# Patient Record
Sex: Male | Born: 1960 | Hispanic: No | Marital: Single | Smoking: Current every day smoker
Health system: Southern US, Community
[De-identification: ages and names within clinical notes are randomized; demographics above are authoritative.]

## PROBLEM LIST (undated history)

## (undated) DIAGNOSIS — M109 Gout, unspecified: Secondary | ICD-10-CM

## (undated) HISTORY — PX: APPENDECTOMY: SHX54

---

## 2014-08-09 ENCOUNTER — Encounter (HOSPITAL_COMMUNITY): Payer: Self-pay | Admitting: Emergency Medicine

## 2014-08-09 ENCOUNTER — Observation Stay (HOSPITAL_COMMUNITY)
Admission: EM | Admit: 2014-08-09 | Discharge: 2014-08-10 | Disposition: A | Discharge status: Home / Self Care | Payer: No Typology Code available for payment source | Attending: Internal Medicine | Admitting: Internal Medicine

## 2014-08-09 ENCOUNTER — Emergency Department (HOSPITAL_COMMUNITY): Payer: No Typology Code available for payment source

## 2014-08-09 DIAGNOSIS — R55 Syncope and collapse: Secondary | ICD-10-CM | POA: Diagnosis not present

## 2014-08-09 DIAGNOSIS — R030 Elevated blood-pressure reading, without diagnosis of hypertension: Secondary | ICD-10-CM

## 2014-08-09 DIAGNOSIS — N179 Acute kidney failure, unspecified: Secondary | ICD-10-CM | POA: Diagnosis not present

## 2014-08-09 DIAGNOSIS — Z8739 Personal history of other diseases of the musculoskeletal system and connective tissue: Secondary | ICD-10-CM | POA: Diagnosis present

## 2014-08-09 DIAGNOSIS — E669 Obesity, unspecified: Secondary | ICD-10-CM | POA: Diagnosis not present

## 2014-08-09 DIAGNOSIS — R51 Headache: Secondary | ICD-10-CM | POA: Diagnosis not present

## 2014-08-09 DIAGNOSIS — F17200 Nicotine dependence, unspecified, uncomplicated: Secondary | ICD-10-CM

## 2014-08-09 DIAGNOSIS — IMO0001 Reserved for inherently not codable concepts without codable children: Secondary | ICD-10-CM | POA: Diagnosis present

## 2014-08-09 DIAGNOSIS — R519 Headache, unspecified: Secondary | ICD-10-CM | POA: Diagnosis present

## 2014-08-09 DIAGNOSIS — E785 Hyperlipidemia, unspecified: Secondary | ICD-10-CM | POA: Diagnosis not present

## 2014-08-09 DIAGNOSIS — Z9089 Acquired absence of other organs: Secondary | ICD-10-CM | POA: Insufficient documentation

## 2014-08-09 DIAGNOSIS — Z6832 Body mass index (BMI) 32.0-32.9, adult: Secondary | ICD-10-CM | POA: Diagnosis not present

## 2014-08-09 DIAGNOSIS — M109 Gout, unspecified: Secondary | ICD-10-CM | POA: Diagnosis not present

## 2014-08-09 HISTORY — DX: Gout, unspecified: M10.9

## 2014-08-09 LAB — URINALYSIS, ROUTINE W REFLEX MICROSCOPIC
Bilirubin Urine: NEGATIVE
GLUCOSE, UA: NEGATIVE mg/dL
Hgb urine dipstick: NEGATIVE
Ketones, ur: NEGATIVE mg/dL
LEUKOCYTES UA: NEGATIVE
Nitrite: NEGATIVE
PH: 6.5 (ref 5.0–8.0)
Protein, ur: 30 mg/dL — AB
Specific Gravity, Urine: 1.018 (ref 1.005–1.030)
Urobilinogen, UA: 0.2 mg/dL (ref 0.0–1.0)

## 2014-08-09 LAB — CBC WITH DIFFERENTIAL/PLATELET
Basophils Absolute: 0 10*3/uL (ref 0.0–0.1)
Basophils Relative: 0 % (ref 0–1)
EOS PCT: 0 % (ref 0–5)
Eosinophils Absolute: 0 10*3/uL (ref 0.0–0.7)
HCT: 46.3 % (ref 39.0–52.0)
HEMOGLOBIN: 16.2 g/dL (ref 13.0–17.0)
LYMPHS ABS: 1.3 10*3/uL (ref 0.7–4.0)
LYMPHS PCT: 12 % (ref 12–46)
MCH: 32.4 pg (ref 26.0–34.0)
MCHC: 35 g/dL (ref 30.0–36.0)
MCV: 92.6 fL (ref 78.0–100.0)
Monocytes Absolute: 1 10*3/uL (ref 0.1–1.0)
Monocytes Relative: 9 % (ref 3–12)
NEUTROS PCT: 79 % — AB (ref 43–77)
Neutro Abs: 8.7 10*3/uL — ABNORMAL HIGH (ref 1.7–7.7)
Platelets: 173 10*3/uL (ref 150–400)
RBC: 5 MIL/uL (ref 4.22–5.81)
RDW: 13.1 % (ref 11.5–15.5)
WBC: 11 10*3/uL — ABNORMAL HIGH (ref 4.0–10.5)

## 2014-08-09 LAB — BASIC METABOLIC PANEL
Anion gap: 17 — ABNORMAL HIGH (ref 5–15)
BUN: 20 mg/dL (ref 6–23)
CO2: 23 meq/L (ref 19–32)
Calcium: 10.1 mg/dL (ref 8.4–10.5)
Chloride: 100 mEq/L (ref 96–112)
Creatinine, Ser: 1.39 mg/dL — ABNORMAL HIGH (ref 0.50–1.35)
GFR calc Af Amer: 66 mL/min — ABNORMAL LOW (ref >90)
GFR calc non Af Amer: 57 mL/min — ABNORMAL LOW (ref >90)
GLUCOSE: 127 mg/dL — AB (ref 70–99)
POTASSIUM: 4.3 meq/L (ref 3.7–5.3)
Sodium: 140 mEq/L (ref 137–147)

## 2014-08-09 LAB — I-STAT TROPONIN, ED: Troponin i, poc: 0.02 ng/mL (ref 0.00–0.08)

## 2014-08-09 LAB — D-DIMER, QUANTITATIVE (NOT AT ARMC)

## 2014-08-09 LAB — URINE MICROSCOPIC-ADD ON: Urine-Other: NONE SEEN

## 2014-08-09 MED ORDER — SODIUM CHLORIDE 0.9 % IV BOLUS (SEPSIS)
1000.0000 mL | Freq: Once | INTRAVENOUS | Status: AC
  Administered 2014-08-09: 1000 mL via INTRAVENOUS

## 2014-08-09 MED ORDER — NICOTINE 14 MG/24HR TD PT24
14.0000 mg | MEDICATED_PATCH | Freq: Every day | TRANSDERMAL | Status: DC
  Filled 2014-08-09 (×2): qty 1

## 2014-08-09 NOTE — H&P (Addendum)
[facility] Admission History and Physical       Kourosh Jablonsky RUE:[id number] DOB: 1961/10/23 DOA: 08/09/2014  Referring physician: EDP PCP: No PCP Per Patient  Specialists:   Chief Complaint:  Passed Out  HPI: Donald Livingston is a 53 y.o. male with a history of Hyperlipidemia and Gout who was at work and suffered a syncopal episode and collapsed and hit the right side of his head.  He was passed out for 30 seconds and awakened with a headache on the right side of his head.   He denied having any prodromal chest pain or headaches or dizziness.    He reports that he had come in from working and he drank some water, and sat down then he drank some more water and he was told that he began to cough and passed out.    He reports that he  Had not eaten, but he states that is not unusual for him.    In the ED he was evaluated and sent for a CT scan of the Head and referred for medical admission.    The CT scan of the head was negative for any acute findings.     Review of Systems:  Constitutional: No Weight Loss, No Weight Gain, Night Sweats, Fevers, Chills, Dizziness, Fatigue, or Generalized Weakness HEENT: No Headaches, Difficulty Swallowing,Tooth/Dental Problems,Sore Throat,  No Sneezing, Rhinitis, Ear Ache, Nasal Congestion, or Post Nasal Drip,  Cardio-vascular:  No Chest pain, Orthopnea, PND, Edema in Lower Extremities, Anasarca, Dizziness, Palpitations  Resp: No Dyspnea, No DOE,  +Non-Productive Cough, No Hemoptysis, No Wheezing.    GI: No Heartburn, Indigestion, Abdominal Pain, Nausea, Vomiting, Diarrhea, Hematemesis, Hematochezia, Melena, Change in Bowel Habits,  Loss of Appetite  GU: No Dysuria, Change in Color of Urine, No Urgency or Frequency, No Flank pain.  Musculoskeletal: No Joint Pain or Swelling, No Decreased Range of Motion, No Back Pain.  Neurologic: +Syncope, No Seizures, Muscle Weakness, Paresthesia, Vision Disturbance or Loss, No Diplopia, No Vertigo, No Difficulty Walking,    Skin: No Rash or Lesions. Psych: No Change in Mood or Affect, No Depression or Anxiety, No Memory loss, No Confusion, or Hallucinations   Past Medical History  Diagnosis Date  . Gout         Hyperlipidemia  Past Surgical History  Procedure Laterality Date  . Appendectomy      age 89     Prior to Admission medications   Medication Sig Start Date End Date Taking? Authorizing Provider  allopurinol (ZYLOPRIM) 300 MG tablet Take 300 mg by mouth daily.   Yes Historical Provider, MD  atorvastatin (LIPITOR) 10 MG tablet Take 10 mg by mouth daily.   Yes Historical Provider, MD  Multiple Vitamins-Minerals (CENTRUM ADULTS PO) Take 1 tablet by mouth daily.   Yes Historical Provider, MD    No Known Allergies   Social History:  reports that he has been smoking.  He does not have any smokeless tobacco history on file. He reports that he drinks alcohol. He reports that he does not use illicit drugs.     History reviewed. No pertinent family history.     Physical Exam:  GEN:  Pleasant  Well Nourished and Well Developed 53 y.o. Asian American male examined  and in no acute distress; cooperative with exam Filed Vitals:   08/09/14 1740 08/09/14 1855  BP: 109/64 103/62  Pulse: 91 85  Resp: 17 18  SpO2: 97% 97%   Blood pressure 103/62, pulse 85, resp. rate 18,  SpO2 97.00%. PSYCH: He is alert and oriented x4; does not appear anxious does not appear depressed; affect is normal HEENT: Normocephalic and Atraumatic, Mucous membranes pink; PERRLA; EOM intact; Fundi:  Benign;  No scleral icterus, Nares: Patent, Oropharynx: Clear, Fair Dentition,    Neck:  FROM, No Cervical Lymphadenopathy nor Thyromegaly or Carotid Bruit; No JVD; Breasts:: Not examined CHEST WALL: No tenderness CHEST: Normal respiration, clear to auscultation bilaterally HEART: Regular rate and rhythm; no murmurs rubs or gallops BACK: No kyphosis or scoliosis; No CVA tenderness ABDOMEN: Positive Bowel Sounds, Soft Non-Tender;  No Masses, No Organomegaly Rectal Exam: Not done EXTREMITIES: No Cyanosis, Clubbing, or Edema; No Ulcerations. Genitalia: not examined PULSES: 2+ and symmetric SKIN: Normal hydration no rash or ulceration CNS:  A x O x 4, No Focal Deficits  Vascular: pulses palpable throughout    Labs on Admission:  Basic Metabolic Panel:  Recent Labs Lab 08/09/14 1852  NA 140  K 4.3  CL 100  CO2 23  GLUCOSE 127*  BUN 20  CREATININE 1.39*  CALCIUM 10.1   Liver Function Tests: No results found for this basename: AST, ALT, ALKPHOS, BILITOT, PROT, ALBUMIN,  in the last 168 hours No results found for this basename: LIPASE, AMYLASE,  in the last 168 hours No results found for this basename: AMMONIA,  in the last 168 hours CBC:  Recent Labs Lab 08/09/14 1852  WBC 11.0*  NEUTROABS 8.7*  HGB 16.2  HCT 46.3  MCV 92.6  PLT 173   Cardiac Enzymes: No results found for this basename: CKTOTAL, CKMB, CKMBINDEX, TROPONINI,  in the last 168 hours  BNP (last 3 results) No results found for this basename: PROBNP,  in the last 8760 hours CBG: No results found for this basename: GLUCAP,  in the last 168 hours  Radiological Exams on Admission: Ct Head Wo Contrast  08/09/2014   CLINICAL DATA:  syncope  EXAM: CT HEAD WITHOUT CONTRAST  TECHNIQUE: Contiguous axial images were obtained from the base of the skull through the vertex without intravenous contrast.  COMPARISON:  None.  FINDINGS: There is no evidence of acute intracranial hemorrhage, brain edema, mass lesion, acute infarction, mass effect, or midline shift. Acute infarct may be inapparent on noncontrast CT. No other intra-axial abnormalities are seen, and the ventricles and sulci are within normal limits in size and symmetry. No abnormal extra-axial fluid collections or masses are identified. No significant calvarial abnormality.  IMPRESSION: 1. Negative for bleed or other acute intracranial process.   Electronically Signed   By: Oley Balm  M.D.   On: 08/09/2014 21:39     EKG: Independently reviewed.    Assessment/Plan:   53 y.o. male with  Principal Problem:   1.   Syncope and collapse- probably Vasovagal   Telemetry   Neuro checks   CT scan of head done and pending Pending   Active Problems:   2.   Headache- post fall from #1    Neuro Checks   CT scan of head done and pending   Pain control PRN     3.   AKI (acute kidney injury)   Gentle Rehydration     4.   Tobacco use disorder   Nicotine Patch daily   Counseled re: Cessation     5.    DVT Prophylaxis   Lovenox      Code Status:   FULL CODE Family Communication:   No Family at Bedside  Disposition Plan:  Time spent:  60 Minutes  Ron Parker [facility] Pager [phone]   If 7AM -7PM Please Contact the Day Rounding Team MD for [facility]  If 7PM-7AM, Please Contact night-coverage  [email or web address] Password [email or web address] 08/09/2014, 9:47 PM

## 2014-08-09 NOTE — ED Provider Notes (Addendum)
CSN: [id number]     Arrival date & time 08/09/14  1719 History   First MD Initiated Contact with Patient 08/09/14 1836     Chief Complaint  Patient presents with  . Loss of Consciousness     (Consider location/radiation/quality/duration/timing/severity/associated sxs/prior Treatment) HPI Comments: Patient presents with syncope. He was at work today and he states he was in and out all day. He came inside to drink some water periodically. On the last time he came inside, he sat down and reportedly he coughed and then lost consciousness. He passed out while he was still seated. He does remember this. He denies any preceding symptoms. There is no dizziness palpitations chest pain or shortness of breath. He denies a past history of syncope. He denies any past cardiac problems. He denies any recent illnesses.  He did state he hit his head after he fell. He has a headache since that time. He denies any other injuries from the fall. Pt lives in  and travels here to work.  No FH of sudden cardiac death.  Patient is a 53 y.o. male presenting with syncope.  Loss of Consciousness Associated symptoms: no chest pain, no diaphoresis, no dizziness, no fever, no headaches, no nausea, no shortness of breath, no vomiting and no weakness     Past Medical History  Diagnosis Date  . Gout    History reviewed. No pertinent past surgical history. History reviewed. No pertinent family history. History  Substance Use Topics  . Smoking status: Never Smoker   . Smokeless tobacco: Not on file  . Alcohol Use: No    Review of Systems  Constitutional: Negative for fever, chills, diaphoresis and fatigue.  HENT: Negative for congestion, rhinorrhea and sneezing.   Eyes: Negative.   Respiratory: Negative for cough, chest tightness and shortness of breath.   Cardiovascular: Positive for syncope. Negative for chest pain and leg swelling.  Gastrointestinal: Negative for nausea, vomiting, abdominal pain, diarrhea  and blood in stool.  Genitourinary: Negative for frequency, hematuria, flank pain and difficulty urinating.  Musculoskeletal: Negative for arthralgias and back pain.  Skin: Negative for rash.  Neurological: Positive for syncope. Negative for dizziness, speech difficulty, weakness, numbness and headaches.      Allergies  Review of patient's allergies indicates no known allergies.  Home Medications   Prior to Admission medications   Medication Sig Start Date End Date Taking? Authorizing Provider  allopurinol (ZYLOPRIM) 300 MG tablet Take 300 mg by mouth daily.   Yes Historical Provider, MD  atorvastatin (LIPITOR) 10 MG tablet Take 10 mg by mouth daily.   Yes Historical Provider, MD  Multiple Vitamins-Minerals (CENTRUM ADULTS PO) Take 1 tablet by mouth daily.   Yes Historical Provider, MD   BP 103/62  Pulse 85  Resp 18  SpO2 97% Physical Exam  Constitutional: He is oriented to person, place, and time. He appears well-developed and well-nourished.  HENT:  Head: Normocephalic.  Small hematoma to right forehead  Eyes: Pupils are equal, round, and reactive to light.  Neck: Normal range of motion. Neck supple.  No pain along the spine  Cardiovascular: Normal rate, regular rhythm and normal heart sounds.   Pulmonary/Chest: Effort normal and breath sounds normal. No respiratory distress. He has no wheezes. He has no rales. He exhibits no tenderness.  Abdominal: Soft. Bowel sounds are normal. There is no tenderness. There is no rebound and no guarding.  Musculoskeletal: Normal range of motion. He exhibits no edema.  No calf tenderness  Lymphadenopathy:  He has no cervical adenopathy.  Neurological: He is alert and oriented to person, place, and time. He has normal strength. No cranial nerve deficit or sensory deficit. GCS eye subscore is 4. GCS verbal subscore is 5. GCS motor subscore is 6.  FTN intact, no pronator drift  Skin: Skin is warm and dry. No rash noted.  Psychiatric: He  has a normal mood and affect.    ED Course  Procedures (including critical care time) Labs Review Results for orders placed during the hospital encounter of 08/09/14  CBC WITH DIFFERENTIAL      Result Value Ref Range   WBC 11.0 (*) 4.0 - 10.5 K/uL   RBC 5.00  4.22 - 5.81 MIL/uL   Hemoglobin 16.2  13.0 - 17.0 g/dL   HCT 16.1  09.6 - 04.5 %   MCV 92.6  78.0 - 100.0 fL   MCH 32.4  26.0 - 34.0 pg   MCHC 35.0  30.0 - 36.0 g/dL   RDW 40.9  81.1 - 91.4 %   Platelets 173  150 - 400 K/uL   Neutrophils Relative % 79 (*) 43 - 77 %   Neutro Abs 8.7 (*) 1.7 - 7.7 K/uL   Lymphocytes Relative 12  12 - 46 %   Lymphs Abs 1.3  0.7 - 4.0 K/uL   Monocytes Relative 9  3 - 12 %   Monocytes Absolute 1.0  0.1 - 1.0 K/uL   Eosinophils Relative 0  0 - 5 %   Eosinophils Absolute 0.0  0.0 - 0.7 K/uL   Basophils Relative 0  0 - 1 %   Basophils Absolute 0.0  0.0 - 0.1 K/uL  BASIC METABOLIC PANEL      Result Value Ref Range   Sodium 140  137 - 147 mEq/L   Potassium 4.3  3.7 - 5.3 mEq/L   Chloride 100  96 - 112 mEq/L   CO2 23  19 - 32 mEq/L   Glucose, Bld 127 (*) 70 - 99 mg/dL   BUN 20  6 - 23 mg/dL   Creatinine, Ser 7.82 (*) 0.50 - 1.35 mg/dL   Calcium 95.6  8.4 - 21.3 mg/dL   GFR calc non Af Amer 57 (*) >90 mL/min   GFR calc Af Amer 66 (*) >90 mL/min   Anion gap 17 (*) 5 - 15  URINALYSIS, ROUTINE W REFLEX MICROSCOPIC      Result Value Ref Range   Color, Urine YELLOW  YELLOW   APPearance CLOUDY (*) CLEAR   Specific Gravity, Urine 1.018  1.005 - 1.030   pH 6.5  5.0 - 8.0   Glucose, UA NEGATIVE  NEGATIVE mg/dL   Hgb urine dipstick NEGATIVE  NEGATIVE   Bilirubin Urine NEGATIVE  NEGATIVE   Ketones, ur NEGATIVE  NEGATIVE mg/dL   Protein, ur 30 (*) NEGATIVE mg/dL   Urobilinogen, UA 0.2  0.0 - 1.0 mg/dL   Nitrite NEGATIVE  NEGATIVE   Leukocytes, UA NEGATIVE  NEGATIVE  D-DIMER, QUANTITATIVE      Result Value Ref Range   D-Dimer, Quant <0.27  0.00 - 0.48 ug/mL-FEU  URINE MICROSCOPIC-ADD ON       Result Value Ref Range   Urine-Other       Value: NO FORMED ELEMENTS SEEN ON URINE MICROSCOPIC EXAMINATION  I-STAT TROPOININ, ED      Result Value Ref Range   Troponin i, poc 0.02  0.00 - 0.08 ng/mL   Comment 3  No results found.    Imaging Review No results found.   EKG Interpretation None      Date: 08/09/2014  Rate: 90  Rhythm: normal sinus rhythm  QRS Axis: normal  Intervals: normal  ST/T Wave abnormalities: normal  Conduction Disutrbances:none  Narrative Interpretation:   Old EKG Reviewed: none available   MDM   Final diagnoses:  Syncope, unspecified syncope type    Patient presents after an episode of syncope. His labs are unremarkable. His EKG is negative for ischemic changes. He's had no apparent arrhythmias on monitoring. However I am concerned about his sudden syncope without preceding symptoms. D-dimer is negative. I feel that he should be monitored overnight and possibly have an echocardiogram in the morning. I will consult hospitalist for admission.    Rolan Bucco, MD 08/09/14 2111  Rolan Bucco, MD 08/09/14 2122

## 2014-08-09 NOTE — ED Notes (Signed)
Per EMS: Pt from work.  Has not eaten today.  Pt states that he was at work, coughed a few times, lost consciousness, and hit the rt side of his head.  No hematoma.  CBG normal.  Was out for about 30 seconds per witnesses.  Only medical hx of gout.

## 2014-08-09 NOTE — ED Notes (Signed)
Patient provided PO fluids and sandwich tray.

## 2014-08-09 NOTE — ED Notes (Signed)
Bed: WA12 Expected date:  Expected time:  Means of arrival:  Comments: EMS 

## 2014-08-09 NOTE — ED Notes (Signed)
Pt aware urine sample is needed 

## 2014-08-09 NOTE — ED Notes (Signed)
Initial contact-A&Ox4. Moving all extremities equally. Confirms EMS report. Denies dizziness, numbness. C/o "extreme headache" after hitting head. Denies blurry vision. Speaking full, clear sentences. RR even/unlabored. MD Belfi at bedside.

## 2014-08-10 ENCOUNTER — Encounter (HOSPITAL_COMMUNITY): Payer: Self-pay

## 2014-08-10 DIAGNOSIS — R030 Elevated blood-pressure reading, without diagnosis of hypertension: Secondary | ICD-10-CM

## 2014-08-10 DIAGNOSIS — E669 Obesity, unspecified: Secondary | ICD-10-CM | POA: Diagnosis present

## 2014-08-10 DIAGNOSIS — Z8739 Personal history of other diseases of the musculoskeletal system and connective tissue: Secondary | ICD-10-CM | POA: Diagnosis present

## 2014-08-10 DIAGNOSIS — IMO0001 Reserved for inherently not codable concepts without codable children: Secondary | ICD-10-CM | POA: Diagnosis present

## 2014-08-10 LAB — BASIC METABOLIC PANEL
ANION GAP: 13 (ref 5–15)
BUN: 17 mg/dL (ref 6–23)
CHLORIDE: 104 meq/L (ref 96–112)
CO2: 26 mEq/L (ref 19–32)
Calcium: 9.6 mg/dL (ref 8.4–10.5)
Creatinine, Ser: 1.01 mg/dL (ref 0.50–1.35)
GFR calc non Af Amer: 84 mL/min — ABNORMAL LOW (ref >90)
Glucose, Bld: 140 mg/dL — ABNORMAL HIGH (ref 70–99)
POTASSIUM: 4.7 meq/L (ref 3.7–5.3)
SODIUM: 143 meq/L (ref 137–147)

## 2014-08-10 LAB — CBC
HCT: 47 % (ref 39.0–52.0)
HEMOGLOBIN: 16.3 g/dL (ref 13.0–17.0)
MCH: 33.2 pg (ref 26.0–34.0)
MCHC: 34.7 g/dL (ref 30.0–36.0)
MCV: 95.7 fL (ref 78.0–100.0)
Platelets: 180 10*3/uL (ref 150–400)
RBC: 4.91 MIL/uL (ref 4.22–5.81)
RDW: 13.3 % (ref 11.5–15.5)
WBC: 5.9 10*3/uL (ref 4.0–10.5)

## 2014-08-10 LAB — RAPID URINE DRUG SCREEN, HOSP PERFORMED
Amphetamines: NOT DETECTED
Barbiturates: NOT DETECTED
Benzodiazepines: NOT DETECTED
COCAINE: NOT DETECTED
OPIATES: NOT DETECTED
Tetrahydrocannabinol: NOT DETECTED

## 2014-08-10 MED ORDER — OXYCODONE HCL 5 MG PO TABS: 5.0000 mg | ORAL_TABLET | ORAL | Status: DC | PRN

## 2014-08-10 MED ORDER — SODIUM CHLORIDE 0.9 % IJ SOLN: 3.0000 mL | Freq: Two times a day (BID) | INTRAMUSCULAR | Status: DC

## 2014-08-10 MED ORDER — ALLOPURINOL 300 MG PO TABS
300.0000 mg | ORAL_TABLET | Freq: Every day | ORAL | Status: DC
  Filled 2014-08-10: qty 1

## 2014-08-10 MED ORDER — ATORVASTATIN CALCIUM 10 MG PO TABS
10.0000 mg | ORAL_TABLET | Freq: Every day | ORAL | Status: DC
  Filled 2014-08-10: qty 1

## 2014-08-10 MED ORDER — ACETAMINOPHEN 325 MG PO TABS: 650.0000 mg | ORAL_TABLET | Freq: Four times a day (QID) | ORAL | Status: DC | PRN

## 2014-08-10 MED ORDER — ENOXAPARIN SODIUM 40 MG/0.4ML ~~LOC~~ SOLN
40.0000 mg | SUBCUTANEOUS | Status: DC
  Filled 2014-08-10: qty 0.4

## 2014-08-10 MED ORDER — ONDANSETRON HCL 4 MG PO TABS: 4.0000 mg | ORAL_TABLET | Freq: Four times a day (QID) | ORAL | Status: DC | PRN

## 2014-08-10 MED ORDER — SODIUM CHLORIDE 0.9 % IV SOLN
INTRAVENOUS | Status: DC
  Administered 2014-08-10: 05:00:00 via INTRAVENOUS

## 2014-08-10 MED ORDER — ACETAMINOPHEN 650 MG RE SUPP: 650.0000 mg | Freq: Four times a day (QID) | RECTAL | Status: DC | PRN

## 2014-08-10 MED ORDER — ONDANSETRON HCL 4 MG/2ML IJ SOLN: 4.0000 mg | Freq: Four times a day (QID) | INTRAMUSCULAR | Status: DC | PRN

## 2014-08-10 MED ORDER — HYDROMORPHONE HCL PF 1 MG/ML IJ SOLN: 0.5000 mg | INTRAMUSCULAR | Status: DC | PRN

## 2014-08-10 MED ORDER — ALUM & MAG HYDROXIDE-SIMETH 200-200-20 MG/5ML PO SUSP: 30.0000 mL | Freq: Four times a day (QID) | ORAL | Status: DC | PRN

## 2014-08-10 NOTE — Discharge Instructions (Signed)
Syncope °Syncope is a medical term for fainting or passing out. This means you lose consciousness and drop to the ground. People are generally unconscious for less than 5 minutes. You may have some muscle twitches for up to 15 seconds before waking up and returning to normal. Syncope occurs more often in older adults, but it can happen to anyone. While most causes of syncope are not dangerous, syncope can be a sign of a serious medical problem. It is important to seek medical care.  °CAUSES  °Syncope is caused by a sudden drop in blood flow to the brain. The specific cause is often not determined. Factors that can bring on syncope include: °· Taking medicines that lower blood pressure. °· Sudden changes in posture, such as standing up quickly. °· Taking more medicine than prescribed. °· Standing in one place for too long. °· Seizure disorders. °· Dehydration and excessive exposure to heat. °· Low blood sugar (hypoglycemia). °· Straining to have a bowel movement. °· Heart disease, irregular heartbeat, or other circulatory problems. °· Fear, emotional distress, seeing blood, or severe pain. °SYMPTOMS  °Right before fainting, you may: °· Feel dizzy or light-headed. °· Feel nauseous. °· See all white or all black in your field of vision. °· Have cold, clammy skin. °DIAGNOSIS  °Your health care provider will ask about your symptoms, perform a physical exam, and perform an electrocardiogram (ECG) to record the electrical activity of your heart. Your health care provider may also perform other heart or blood tests to determine the cause of your syncope which may include: °· Transthoracic echocardiogram (TTE). During echocardiography, sound waves are used to evaluate how blood flows through your heart. °· Transesophageal echocardiogram (TEE). °· Cardiac monitoring. This allows your health care provider to monitor your heart rate and rhythm in real time. °· Holter monitor. This is a portable device that records your  heartbeat and can help diagnose heart arrhythmias. It allows your health care provider to track your heart activity for several days, if needed. °· Stress tests by exercise or by giving medicine that makes the heart beat faster. °TREATMENT  °In most cases, no treatment is needed. Depending on the cause of your syncope, your health care provider may recommend changing or stopping some of your medicines. °HOME CARE INSTRUCTIONS °· Have someone stay with you until you feel stable. °· Do not drive, use machinery, or play sports until your health care provider says it is okay. °· Keep all follow-up appointments as directed by your health care provider. °· Lie down right away if you start feeling like you might faint. Breathe deeply and steadily. Wait until all the symptoms have passed. °· Drink enough fluids to keep your urine clear or pale yellow. °· If you are taking blood pressure or heart medicine, get up slowly and take several minutes to sit and then stand. This can reduce dizziness. °SEEK IMMEDIATE MEDICAL CARE IF:  °· You have a severe headache. °· You have unusual pain in the chest, abdomen, or back. °· You are bleeding from your mouth or rectum, or you have black or tarry stool. °· You have an irregular or very fast heartbeat. °· You have pain with breathing. °· You have repeated fainting or seizure-like jerking during an episode. °· You faint when sitting or lying down. °· You have confusion. °· You have trouble walking. °· You have severe weakness. °· You have vision problems. °If you fainted, call your local emergency services (911 in [place] Do not drive   yourself to the hospital.  °MAKE SURE YOU: °· Understand these instructions. °· Will watch your condition. °· Will get help right away if you are not doing well or get worse. °Document Released: 11/24/2005 Document Revised: 11/29/2013 Document Reviewed: 01/23/2012 °[employer]® Patient Information ©2015 [employer]. This information is not intended to replace  advice given to you by your health care provider. Make sure you discuss any questions you have with your health care provider. ° °

## 2014-08-10 NOTE — Progress Notes (Signed)
Pt states he is ready to go and is about to remove his IV and walk out. Pt encouraged to wait for MD to eval and assure a safe discharge home. Pt is calm, in bed and continues to wait at this time.

## 2014-08-10 NOTE — Progress Notes (Signed)
UR Completed.  Donald Livingston Jane [phone]  

## 2014-08-10 NOTE — Discharge Summary (Addendum)
Discharge Summary  Donald Livingston EAV:[id number] DOB: 06/24/1961  PCP: Dr. Janine Ores  Phone:   [phone]   Fax: [phone]  Admit date: 08/09/2014 Discharge date: 08/10/2014  Time spent: 25 minutes  Recommendations for Outpatient Follow-up:  1. Patient will follow up with his primary care physician in [place] next week. At that time advised to recheck blood pressure  2.  he's been advised to not return back to work until 9/4 and stable well-hydrated  Discharge Diagnoses:  Active Hospital Problems   Diagnosis Date Noted  . Syncope and collapse 08/09/2014  . Obesity (BMI 30-39.9) 08/10/2014  . History of gout 08/10/2014  . Elevated blood pressure 08/10/2014  . Headache 08/09/2014  . AKI (acute kidney injury) 08/09/2014  . Tobacco use disorder 08/09/2014    Resolved Hospital Problems   Diagnosis Date Noted Date Resolved  No resolved problems to display.    Discharge Condition:  improved, being discharged home   Diet recommendation:  regular   Filed Weights   08/10/14 0001  Weight: 92.4 kg (203 lb 11.3 oz)    History of present illness:   53 year old Asian male from [place] who is in [place] for his work when yesterday, 9/2 he was coming in and out of the sun back to work and all of a sudden immediately passed out. He stated that he might have felt a little lightheaded before hand, but had no issues. Coworkers were with him and patient to regain consciousness only a few seconds later. He had hit his head when he fell. Patient complained of a headache and seeing if she spots when he awoke, but no other findings. He was brought into the emergency room for further evaluation. Laboratory noted a minimal white count elevation of 11 and elevated creatinine of 1.3, but other lab work was unrevealing. Patient was admitted to the hospitalist service overnight for observation   Hospital Course:  Principal Problem:   Syncope and collapse: Suspected to be orthostatic versus  vasovagal. On IV fluids, creatinine improved the following day to 1.0. Patient's orthostatics were checked and found to be unrevealing by following day. Urine drug screen unremarkable. White blood cell count which have been minimally elevated on admission, normalized by the following day and if anything possibly stress margination. Hemoglobin did not change. CT scan done on admission was unrevealing. Patient was kept on telemetry, no events occurred. He remained in normal sinus rhythm during his hospitalization. With no further findings, patient is felt to be stable for discharge and advised to stay hydrated. He'll followup with his PCP  Active Problems:   Headache: Secondary to hitting his head when he fell. No neurological findings. CT scan of the head unremarkable. By following day, still present, but much less so    AKI (acute kidney injury): As above. Secondary dehydration. Resolved.    Tobacco use disorder patient counseled to quit. On nicotine patch during hospitalization.    Obesity (BMI 30-39.9): Patient is criteria would BMI greater than 30.    History of gout: Stable medical issue. Patient will continue allopurinol on discharge.    Elevated blood pressure: Fall and hydration, patient's blood pressure noted to be in the 130s-140 systolic, 90s diastolic. Her condition is for PCP to follow this up   Procedures:   none   Consultations:   none   Discharge Exam: BP 139/91  Pulse 67  Temp(Src) 98.2 F (36.8 C) (Oral)  Resp 18  Ht  (1.676 m)  Wt 92.4 kg (203 lb  11.3 oz)  BMI 32.89 kg/m2  SpO2 96%  General: Alert and oriented x3, no acute distress  Cardiovascular:  regular rate and rhythm, S1-S2  Respiratory:  clear to auscultation bilaterally   Discharge Instructions You were cared for by a hospitalist during your hospital stay. If you have any questions about your discharge medications or the care you received while you were in the hospital after you are discharged,  you can call the unit and asked to speak with the hospitalist on call if the hospitalist that took care of you is not available. Once you are discharged, your primary care physician will handle any further medical issues. Please note that NO REFILLS for any discharge medications will be authorized once you are discharged, as it is imperative that you return to your primary care physician (or establish a relationship with a primary care physician if you do not have one) for your aftercare needs so that they can reassess your need for medications and monitor your lab values.     Medication List         allopurinol 300 MG tablet  Commonly known as:  ZYLOPRIM  Take 300 mg by mouth daily.     atorvastatin 10 MG tablet  Commonly known as:  LIPITOR  Take 10 mg by mouth daily.     CENTRUM ADULTS PO  Take 1 tablet by mouth daily.       No Known Allergies     Follow-up Information   Follow up with Dr. Janine Ores Phone:   [phone]   Fax: [phone] In 1 week.       The results of significant diagnostics from this hospitalization (including imaging, microbiology, ancillary and laboratory) are listed below for reference.    Significant Diagnostic Studies: Ct Head Wo Contrast  08/09/2014     IMPRESSION: 1. Negative for bleed or other acute intracranial process.   Electronically Signed   By: Oley Balm M.D.   On: 08/09/2014 21:39    Labs: Basic Metabolic Panel:  Recent Labs Lab 08/09/14 1852 08/10/14 1041  NA 140 143  K 4.3 4.7  CL 100 104  CO2 23 26  GLUCOSE 127* 140*  BUN 20 17  CREATININE 1.39* 1.01  CALCIUM 10.1 9.6   CBC:  Recent Labs Lab 08/09/14 1852 08/10/14 1041  WBC 11.0* 5.9  NEUTROABS 8.7*  --   HGB 16.2 16.3  HCT 46.3 47.0  MCV 92.6 95.7  PLT 173 180     Signed:  KRISHNAN,SENDIL K  [facility] 08/10/2014, 2:00 PM

## 2015-09-02 IMAGING — CT CT HEAD W/O CM
2 series · 16 of 30 positions shown, 20 images · non-contrast
Comparison: None.

CLINICAL DATA: syncope

EXAM:
CT HEAD WITHOUT CONTRAST
TECHNIQUE: Contiguous axial images were obtained from the base of the skull
through the vertex without intravenous contrast.

[Series 2: head w/o · axial · non-contrast · 0.45mm/px · z∈[-73,+47]mm · 13 of 30 slices shown, 17 images]
[im 3/30  brain]
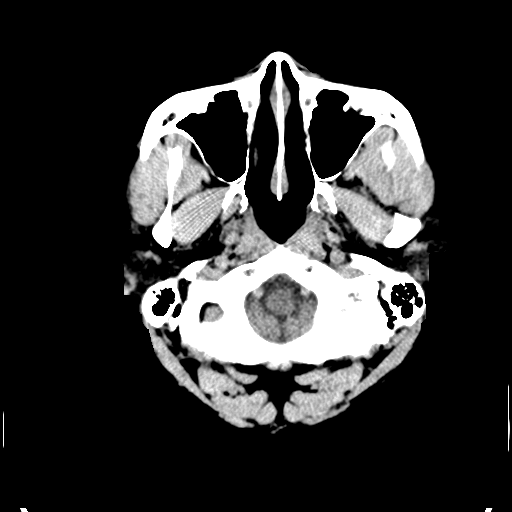
[im 3/30  bone]
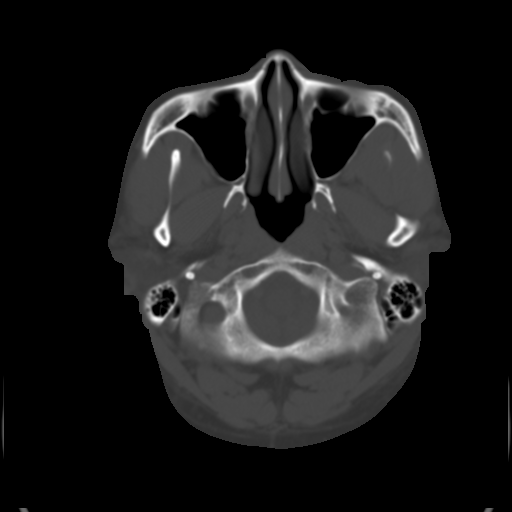
[im 5/30  brain]
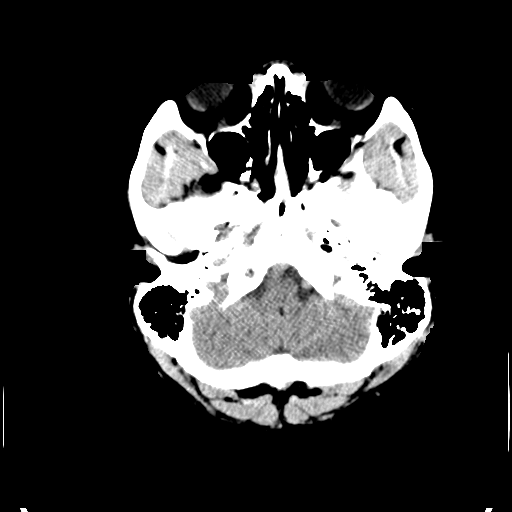
[im 7/30  brain]
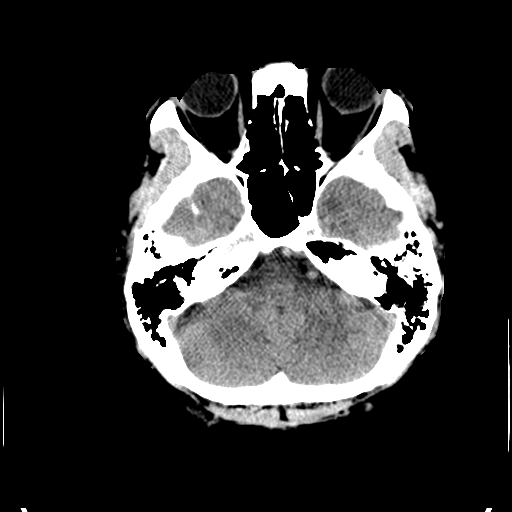
[im 9/30  brain]
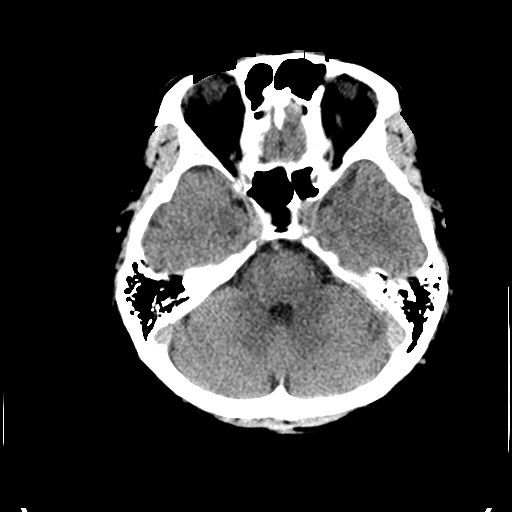
[im 11/30  brain]
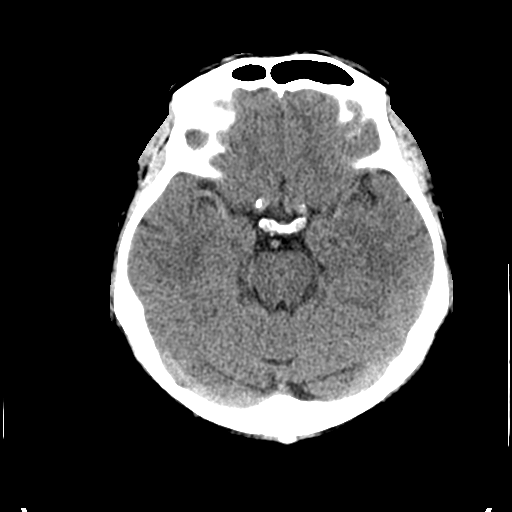
[im 11/30  bone]
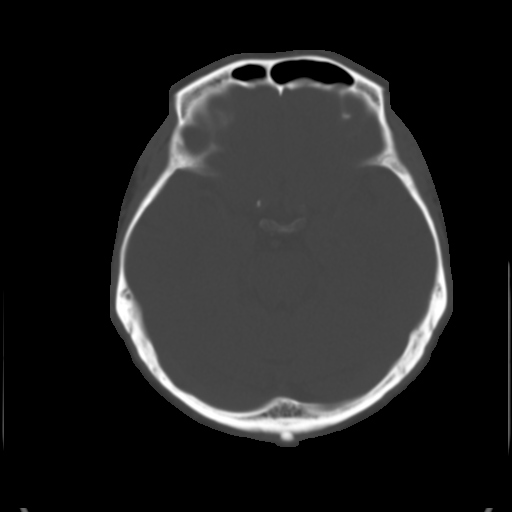
[im 13/30  brain]
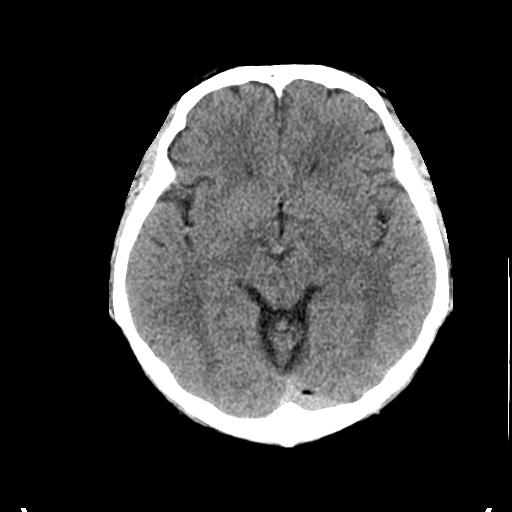
[im 15/30  brain]
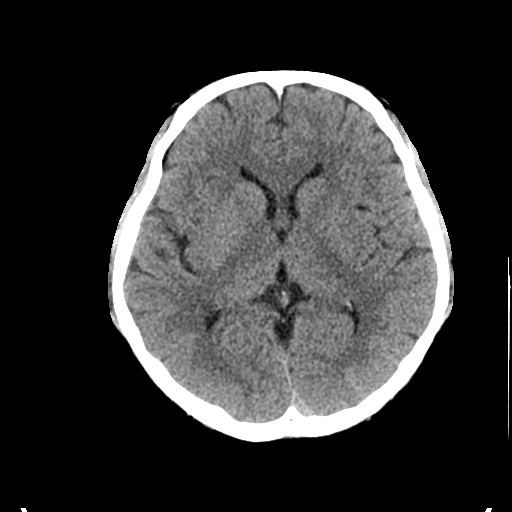
[im 17/30  brain]
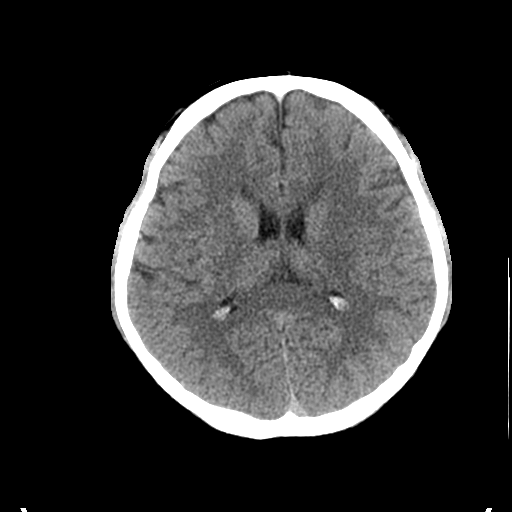
[im 19/30  brain]
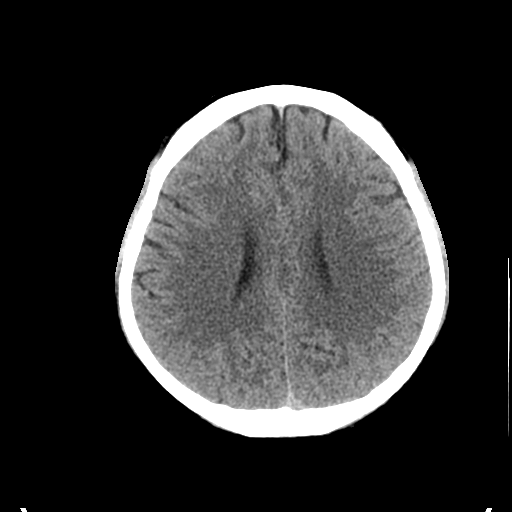
[im 19/30  bone]
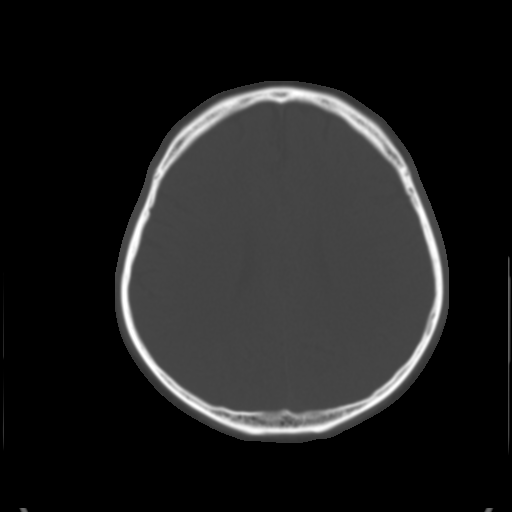
[im 21/30  brain]
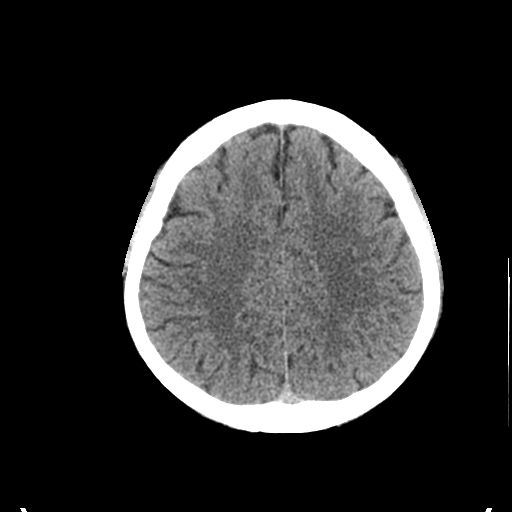
[im 23/30  brain]
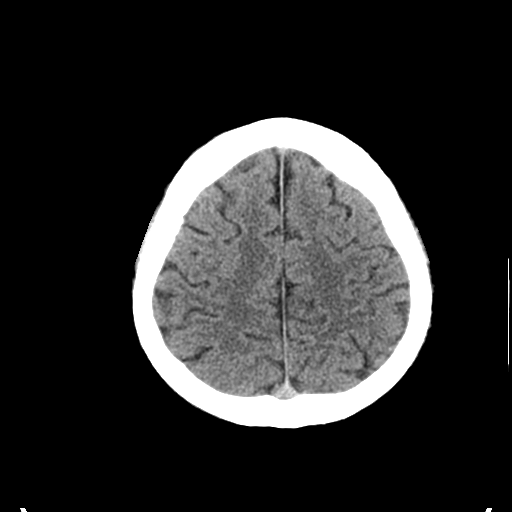
[im 25/30  brain]
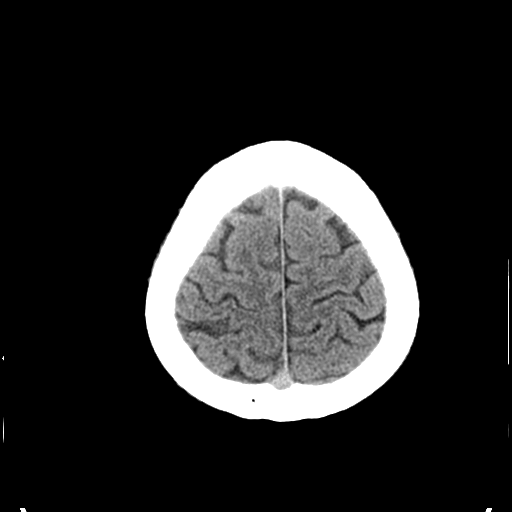
[im 27/30  brain]
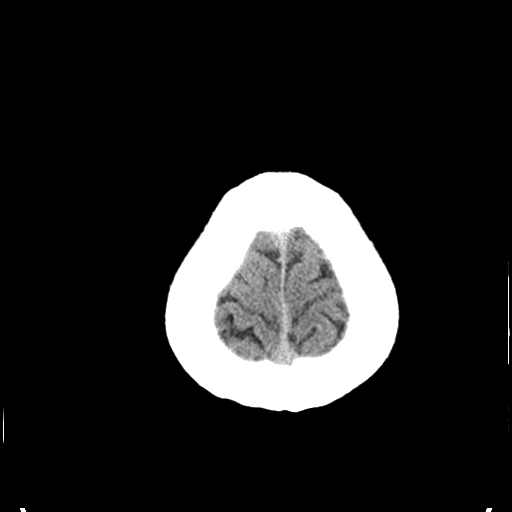
[im 27/30  bone]
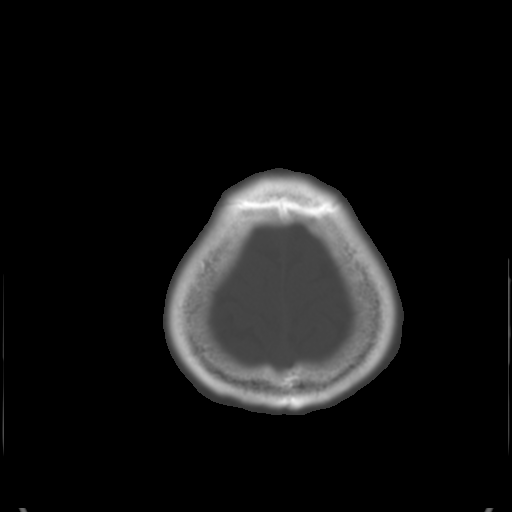

[Series 3: bone windows · axial · 0.45mm/px · z∈[-73,-33]mm · 3 of 30 slices shown]
[im 3/30  bone]
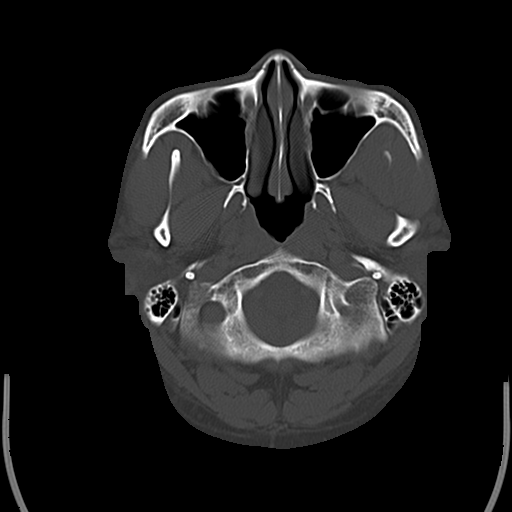
[im 7/30  bone]
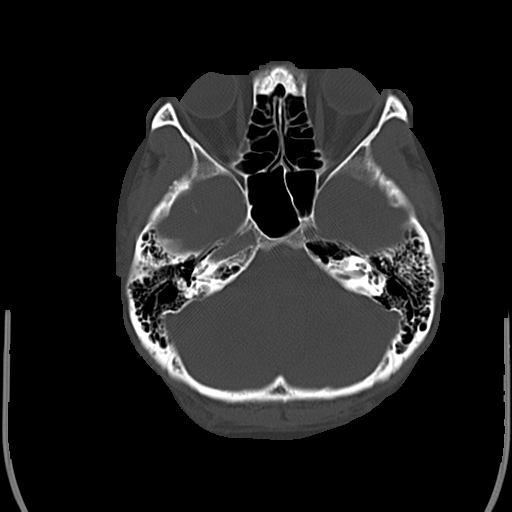
[im 11/30  bone]
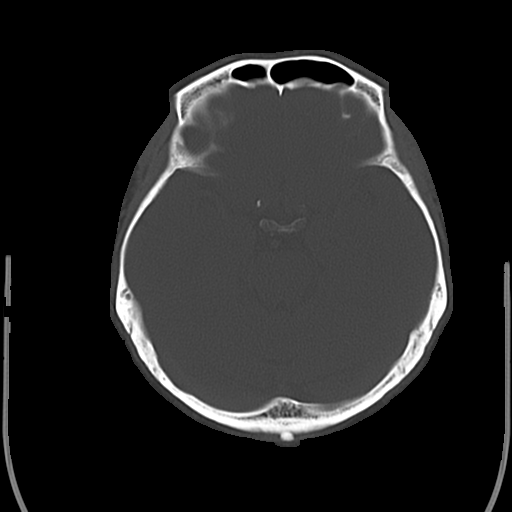

[16 of 30 positions shown; findings below may reference images not displayed]

FINDINGS: There is no evidence of acute intracranial hemorrhage, brain edema,
mass lesion, acute infarction, mass effect, or midline shift. Acute
infarct may be inapparent on noncontrast CT. No other intra-axial
abnormalities are seen, and the ventricles and sulci are within
normal limits in size and symmetry. No abnormal extra-axial fluid
collections or masses are identified. No significant calvarial
abnormality.
IMPRESSION: 1. Negative for bleed or other acute intracranial process.
